# Patient Record
Sex: Female | Born: 1965 | Race: White | Hispanic: No | Smoking: Never smoker
Health system: Southern US, Community
[De-identification: ages and names within clinical notes are randomized; demographics above are authoritative.]

## PROBLEM LIST (undated history)

## (undated) DIAGNOSIS — Z87440 Personal history of urinary (tract) infections: Secondary | ICD-10-CM

## (undated) DIAGNOSIS — K5792 Diverticulitis of intestine, part unspecified, without perforation or abscess without bleeding: Secondary | ICD-10-CM

## (undated) DIAGNOSIS — A749 Chlamydial infection, unspecified: Secondary | ICD-10-CM

## (undated) DIAGNOSIS — M069 Rheumatoid arthritis, unspecified: Secondary | ICD-10-CM

## (undated) DIAGNOSIS — A499 Bacterial infection, unspecified: Secondary | ICD-10-CM

## (undated) HISTORY — DX: Rheumatoid arthritis, unspecified: M06.9

## (undated) HISTORY — DX: Bacterial infection, unspecified: A49.9

## (undated) HISTORY — DX: Personal history of urinary (tract) infections: Z87.440

## (undated) HISTORY — PX: TUBAL LIGATION: SHX77

## (undated) HISTORY — DX: Chlamydial infection, unspecified: A74.9

## (undated) HISTORY — PX: SHOULDER SURGERY: SHX246

---

## 1999-01-28 ENCOUNTER — Other Ambulatory Visit: Admission: RE | Admit: 1999-01-28 | Discharge: 1999-01-28 | Payer: Self-pay | Admitting: *Deleted

## 2000-08-22 ENCOUNTER — Other Ambulatory Visit: Admission: RE | Admit: 2000-08-22 | Discharge: 2000-08-22 | Payer: Self-pay | Admitting: Obstetrics and Gynecology

## 2001-06-19 ENCOUNTER — Encounter: Payer: Self-pay | Admitting: Obstetrics and Gynecology

## 2001-06-19 ENCOUNTER — Encounter: Admission: RE | Admit: 2001-06-19 | Discharge: 2001-06-19 | Payer: Self-pay | Admitting: Obstetrics and Gynecology

## 2001-08-22 ENCOUNTER — Other Ambulatory Visit: Admission: RE | Admit: 2001-08-22 | Discharge: 2001-08-22 | Payer: Self-pay | Admitting: Obstetrics and Gynecology

## 2002-09-04 ENCOUNTER — Other Ambulatory Visit: Admission: RE | Admit: 2002-09-04 | Discharge: 2002-09-04 | Payer: Self-pay | Admitting: Obstetrics and Gynecology

## 2003-07-24 ENCOUNTER — Ambulatory Visit (HOSPITAL_COMMUNITY): Admission: RE | Admit: 2003-07-24 | Discharge: 2003-07-24 | Payer: Self-pay | Admitting: Obstetrics and Gynecology

## 2003-07-24 ENCOUNTER — Encounter (INDEPENDENT_AMBULATORY_CARE_PROVIDER_SITE_OTHER): Payer: Self-pay | Admitting: Specialist

## 2003-09-22 ENCOUNTER — Other Ambulatory Visit: Admission: RE | Admit: 2003-09-22 | Discharge: 2003-09-22 | Payer: Self-pay | Admitting: Obstetrics and Gynecology

## 2005-10-18 ENCOUNTER — Other Ambulatory Visit: Admission: RE | Admit: 2005-10-18 | Discharge: 2005-10-18 | Payer: Self-pay | Admitting: Obstetrics and Gynecology

## 2006-06-23 ENCOUNTER — Encounter: Admission: RE | Admit: 2006-06-23 | Discharge: 2006-06-23 | Payer: Self-pay | Admitting: Obstetrics and Gynecology

## 2006-06-30 ENCOUNTER — Encounter: Admission: RE | Admit: 2006-06-30 | Discharge: 2006-06-30 | Payer: Self-pay | Admitting: Obstetrics and Gynecology

## 2009-01-09 ENCOUNTER — Encounter: Admission: RE | Admit: 2009-01-09 | Discharge: 2009-01-09 | Payer: Self-pay | Admitting: Family Medicine

## 2009-08-31 ENCOUNTER — Encounter: Admission: RE | Admit: 2009-08-31 | Discharge: 2009-08-31 | Payer: Self-pay | Admitting: Obstetrics and Gynecology

## 2009-09-21 ENCOUNTER — Encounter: Admission: RE | Admit: 2009-09-21 | Discharge: 2009-09-21 | Payer: Self-pay | Admitting: Obstetrics and Gynecology

## 2010-01-30 ENCOUNTER — Emergency Department (HOSPITAL_COMMUNITY): Admission: EM | Admit: 2010-01-30 | Discharge: 2010-01-30 | Payer: Self-pay | Admitting: Family Medicine

## 2010-09-07 ENCOUNTER — Encounter: Admission: RE | Admit: 2010-09-07 | Discharge: 2010-09-07 | Payer: Self-pay | Admitting: Family Medicine

## 2011-04-01 NOTE — Op Note (Signed)
NAME:  Mikayla Franklin, Mikayla Franklin                          ACCOUNT NO.:  [id number]   MEDICAL RECORD NO.:  [id number]                   PATIENT TYPE:  AMB   LOCATION:  SDC                                  FACILITY:  [facility]   PHYSICIAN:  Janine Limbo, M.D.            DATE OF BIRTH:  12/20/1965   DATE OF PROCEDURE:  07/24/2003  DATE OF DISCHARGE:                                 OPERATIVE REPORT   PREOPERATIVE DIAGNOSES:  1. Irregular uterine bleeding.  2. Stenotic cervix.  3. Anteflexed uterus.   POSTOPERATIVE DIAGNOSES:  1. Irregular uterine bleeding.  2. Stenotic cervix.  3. Anteflexed uterus.   PROCEDURE:  Diagnostic hysteroscopy with dilatation and curettage.   SURGEON:  Janine Limbo, M.D.   ANESTHESIA:  Monitored anesthetic control and paracervical block using 0.5%  Marcaine with epinephrine.   DISPOSITION:  [name] is a 45 year old female, para 3-0-0-3, who presents  with the above-mentioned diagnoses.  She understands the indications for her  procedure and she accepts the risks of, but not limited to, anesthetic  complications, bleeding, infection, and possible damage to the surrounding  organs.   FINDINGS:  The uterus sounded to 8 cm.  The uterus was severely anteflexed.  Upon visualization of the endometrial cavity, the cavity was noted to be  smooth.  The tubal ostia were easily visualized.  There were no abnormal  lesions present.  There was a moderate amount of endometrial tissue removed  on curettage.   DESCRIPTION OF PROCEDURE:  The patient was taken to the operating room,  where she was given medications through her IV line.  The patient's perineum  and vagina were prepped with multiple layers of Betadine.  Examination under  anesthesia was performed.  The bladder was drained of urine.  She was then  sterilely draped.  A paracervical block was placed using 10 mL of 0.5%  Marcaine with epinephrine.  An endocervical curettage was performed.  The  uterus  sounded to 8 cm.  The cervix was gradually dilated.  The diagnostic  hysteroscope was inserted and the endometrial cavity was visualized.  Pictures were taken.  The cavity was then curetted sharply.  The cavity was  felt to be clean at the end of our procedure.  The hysteroscope was inserted  once again and the cavity was again inspected.  There was no evidence of  damage to the uterus.  Hemostasis was adequate.  All instruments were  removed.  Repeat examination showed the uterus to be firm.  The sponge,  needle, and instrument counts were correct.  The estimated blood loss was 10  mL.  There was a 60 mL fluid deficit on hysteroscopy, but there was fluid  noted on the floor.  The patient was awakened from her anesthetic and taken  to the recovery room in stable condition.  She tolerated her procedure well.   POSTOPERATIVE INSTRUCTIONS:  The patient will  return to see Dr. Stefano Gaul in  two to three weeks for follow-up examination.  She was given a copy of the  postoperative instruction sheet as prepared by the [facility]  of [facility] for patients who have undergone a dilatation and curettage.  She was given a prescription for Darvocet-N 100, and she will use one tablet  every six hours as needed for pain.  She will call with questions or  concerns.                                               Janine Limbo, M.D.    AVS/MEDQ  D:  07/24/2003  T:  07/24/2003  Job:  [id number]

## 2011-04-01 NOTE — H&P (Signed)
   NAME:  Mikayla Franklin, Mikayla Franklin                    ACCOUNT NO.:  [id number]   MEDICAL RECORD NO.:  [id number]                   PATIENT TYPE:  AMB   LOCATION:  SDC                                  FACILITY:  [facility]   PHYSICIAN:  Janine Limbo, M.D.            DATE OF BIRTH:  Sep 01, 1966   DATE OF ADMISSION:  DATE OF DISCHARGE:                                HISTORY & PHYSICAL   HISTORY OF PRESENT ILLNESS:  The patient is a 46 year old female, para 3-0-0-  3, who presents for a dilatation and curettage with hysteroscopy because of  irregular bleeding.  We were unable to perform a hydrosonogram in the  office.   PAST MEDICAL HISTORY:  The patient has had three cesarean sections and a  tubal ligation in the past.  Generally speaking she is very healthy.   DRUG ALLERGIES:  The patient reports that she is sensitive to environmental  allergens and she treats these with over-the-counter antihistamines.   REVIEW OF SYSTEMS:  The patient complains of irregular bleeding as mentioned  above.   FAMILY HISTORY:  Noncontributory.   SOCIAL HISTORY:  The patient denies cigarette use, alcohol use, and  recreational drug use.   PHYSICAL EXAMINATION:  VITAL SIGNS:  Weight is 165 pounds.  HEENT:  Within normal limits.  CHEST:  Clear.  HEART:  Regular rate and rhythm.  BREASTS:  Without masses.  ABDOMEN:  Nontender.  EXTREMITIES:  Within normal limits.  NEUROLOGICAL:  Grossly normal.  PELVIC:  External genitalia is normal.  The vagina is normal.  The cervix is  very anterior and it is difficult to visualize the cervix and the os.  It is  very uncomfortable to try to manipulate the cervix to bring it into vision.  The uterus is approximately 10 cm in size and is severely retroflexed.  Adnexa:  No masses appreciated.  Rectovaginal exam confirms.   LABORATORY AND ACCESSORY DATA:  Ultrasound performed on June 18, 2003  showed an 11.1 x 6.5 x 5.5 cm uterus.  The ovaries appeared normal.  The  endometrial thickness was 1.48 cm.   ASSESSMENT:  1. Irregular bleeding.  2. Retroflexed uterus.  3. Unable to visualize the cervix in the office.    PLAN:  The patient will undergo a dilatation and curettage.  She understands  the indications for her procedure and she accepts the risks of, but not  limited to, anesthetic complications, bleeding, infections, and possible  damage to the surrounding organs.                                               Janine Limbo, M.D.    AVS/MEDQ  D:  07/21/2003  T:  07/21/2003  Job:  [id number]

## 2011-08-25 ENCOUNTER — Other Ambulatory Visit: Payer: Self-pay | Admitting: Family Medicine

## 2011-08-25 DIAGNOSIS — M542 Cervicalgia: Secondary | ICD-10-CM

## 2011-09-01 ENCOUNTER — Other Ambulatory Visit: Payer: Self-pay

## 2011-09-06 ENCOUNTER — Ambulatory Visit
Admission: RE | Admit: 2011-09-06 | Discharge: 2011-09-06 | Disposition: A | Discharge status: Home / Self Care | Payer: BC Managed Care – PPO | Source: Ambulatory Visit | Attending: Family Medicine | Admitting: Family Medicine

## 2011-09-06 DIAGNOSIS — M542 Cervicalgia: Secondary | ICD-10-CM

## 2012-10-05 ENCOUNTER — Ambulatory Visit
Admission: RE | Admit: 2012-10-05 | Discharge: 2012-10-05 | Disposition: A | Discharge status: Home / Self Care | Payer: BC Managed Care – PPO | Source: Ambulatory Visit | Attending: Rheumatology | Admitting: Rheumatology

## 2012-10-05 ENCOUNTER — Other Ambulatory Visit: Payer: Self-pay | Admitting: Rheumatology

## 2012-10-05 DIAGNOSIS — Z0000 Encounter for general adult medical examination without abnormal findings: Secondary | ICD-10-CM

## 2013-01-04 ENCOUNTER — Telehealth: Payer: Self-pay | Admitting: Obstetrics and Gynecology

## 2013-01-17 ENCOUNTER — Ambulatory Visit: Payer: 59 | Admitting: Obstetrics and Gynecology

## 2013-01-17 ENCOUNTER — Encounter: Payer: Self-pay | Admitting: Obstetrics and Gynecology

## 2013-01-17 VITALS — BP 104/70 | Temp 98.6°F | Ht 61.0 in | Wt 159.0 lb

## 2013-01-17 DIAGNOSIS — Z01419 Encounter for gynecological examination (general) (routine) without abnormal findings: Secondary | ICD-10-CM

## 2013-01-17 DIAGNOSIS — N898 Other specified noninflammatory disorders of vagina: Secondary | ICD-10-CM

## 2013-01-17 DIAGNOSIS — Z124 Encounter for screening for malignant neoplasm of cervix: Secondary | ICD-10-CM

## 2013-01-17 DIAGNOSIS — R300 Dysuria: Secondary | ICD-10-CM

## 2013-01-17 DIAGNOSIS — R102 Pelvic and perineal pain: Secondary | ICD-10-CM

## 2013-01-17 LAB — POCT URINALYSIS DIPSTICK: pH, UA: 7

## 2013-01-17 LAB — POCT WET PREP (WET MOUNT): Whiff Test: NEGATIVE

## 2013-01-17 NOTE — Progress Notes (Signed)
Subjective:    Mikayla Franklin is a 47 y.o. female, G3P0, who presents for an annual exam. The patient reports left lower quadrant pelvic pain for the past 3 weeks that is dull and made worse by lying on the left side, certain sitting positions, and wearing tight paints. Denies urinary tract symptoms, bowel symptoms or irregular bleeding.  Admits to vaginal discharge and some increase in her left lower quadrant pelvic discomfort when she urinates.  Menstrual cycle:   LMP: Patient's last menstrual period was 01/06/2013. Very light flow x 5 days, only uses toilet paper that is changed 3 times a day             Review of Systems Pertinent items are noted in HPI. Denies pelvic pain, urinary tract symptoms, vaginitis symptoms, irregular bleeding, menopausal symptoms, change in bowel habits or rectal bleeding   Objective:    BP 104/70  Temp(Src) 98.6 F (37 C)  Ht 5\' 1"  (1.549 m)  Wt 159 lb (72.122 kg)  BMI 30.06 kg/m2  LMP 01/06/2013    Wt Readings from Last 1 Encounters:  01/17/13 159 lb (72.122 kg)   Body mass index is 30.06 kg/(m^2). General Appearance: Alert, no acute distress HEENT: Grossly normal Neck / Thyroid: Supple, no thyromegaly or cervical adenopathy Lungs: Clear to auscultation bilaterally Back: No CVA tenderness Breast Exam: No masses or nodes.No dimpling, nipple retraction or discharge. Cardiovascular: Regular rate and rhythm.  Gastrointestinal: Soft, non-tender, no masses or organomegaly Pelvic Exam: EGBUS-wnl, vagina-normal rugae, cervix- without lesions or tenderness, uterus appears normal size shape and consistency, adnexae-no masses and only mild  tenderness in both adnexae Lymphatic Exam: Non-palpable nodes in neck, clavicular,  axillary, or inguinal regions  Skin: no rashes or abnormalities Extremities: no clubbing cyanosis or edema  Neurologic: grossly normal Psychiatric: Alert and oriented  Wet Prep: pH-4.5, whiff-negative, no clue, trich or  yeast Urinalysis: SG: 1.010, pH-7.0, trace-leukocytes   Assessment:   Routine GYN Exam Pelvic Pain (LLQ) Rheumatoid Arthritis Plan:  Pelvic ultrasound to rule out pelvic masses   PAP sent  RTO 1 year or prn  Crisanto Nied,ELMIRAPA-C

## 2013-01-17 NOTE — Progress Notes (Signed)
Regular Periods: no Mammogram: yes  Monthly Breast Ex.: no Exercise: no  Tetanus < 10 years: no Seatbelts: yes  NI. Bladder Functn.: yes Abuse at home: no  Daily BM's: yes Stressful Work: no  Healthy Diet: yes Sigmoid-Colonoscopy: no  Calcium: no Medical problems this year: pain on left side   LAST PAP:4 years ago  Contraception: btl  Mammogram:  4 years ago  PCP: Dr. Clelia Croft  PMH: no change  FMH: no change  Last Bone Scan: no  Pt is married

## 2013-01-18 LAB — PAP IG W/ RFLX HPV ASCU

## 2013-01-24 ENCOUNTER — Other Ambulatory Visit: Payer: 59

## 2013-01-24 ENCOUNTER — Ambulatory Visit: Payer: 59 | Admitting: Obstetrics and Gynecology

## 2013-01-24 ENCOUNTER — Encounter: Payer: Self-pay | Admitting: Obstetrics and Gynecology

## 2013-01-24 VITALS — BP 100/62 | Temp 98.3°F | Wt 156.0 lb

## 2013-01-24 DIAGNOSIS — R102 Pelvic and perineal pain: Secondary | ICD-10-CM

## 2013-01-24 NOTE — Progress Notes (Signed)
47 YO with pelvic pain for ultrasound.  O: U/S: uterus-7.54 x 5.65 x 4.84 cm; left ovary 2.05 x 1.59 x 1.33 cm and right ovary 3.78 x 1.98 x 2.19 cm no fluid in CDS and normal adnexae   A: Pelvic Pain  P:  Reviewed causes of pelvic pain: urogenital, previous surgery, gastrointestinal and musculoskeletal.       F/U with PCP for pelvic pain evaluation-referred to Dr. Brayton El       RTO-as scheduled or prn.  POWELL,ELMIRA

## 2013-03-11 ENCOUNTER — Other Ambulatory Visit: Payer: Self-pay | Admitting: Family Medicine

## 2013-03-11 DIAGNOSIS — R110 Nausea: Secondary | ICD-10-CM

## 2013-03-13 ENCOUNTER — Ambulatory Visit
Admission: RE | Admit: 2013-03-13 | Discharge: 2013-03-13 | Disposition: A | Discharge status: Home / Self Care | Payer: 59 | Source: Ambulatory Visit | Attending: Family Medicine | Admitting: Family Medicine

## 2013-03-13 DIAGNOSIS — R110 Nausea: Secondary | ICD-10-CM

## 2013-03-28 ENCOUNTER — Other Ambulatory Visit (HOSPITAL_COMMUNITY): Payer: Self-pay | Admitting: Family Medicine

## 2013-03-28 DIAGNOSIS — R110 Nausea: Secondary | ICD-10-CM

## 2013-04-17 ENCOUNTER — Encounter (HOSPITAL_COMMUNITY)
Admission: RE | Admit: 2013-04-17 | Discharge: 2013-04-17 | Disposition: A | Discharge status: Home / Self Care | Payer: 59 | Source: Ambulatory Visit | Attending: Family Medicine | Admitting: Family Medicine

## 2013-04-17 DIAGNOSIS — R110 Nausea: Secondary | ICD-10-CM | POA: Insufficient documentation

## 2013-04-17 MED ORDER — TECHNETIUM TC 99M SULFUR COLLOID
2.0000 | Freq: Once | INTRAVENOUS | Status: AC | PRN
  Administered 2013-04-17: 2 via INTRAVENOUS

## 2014-08-26 ENCOUNTER — Emergency Department (HOSPITAL_COMMUNITY)
Admission: EM | Admit: 2014-08-26 | Discharge: 2014-08-27 | Disposition: A | Discharge status: Home / Self Care | Payer: 59 | Attending: Emergency Medicine | Admitting: Emergency Medicine

## 2014-08-26 ENCOUNTER — Encounter (HOSPITAL_COMMUNITY): Payer: Self-pay | Admitting: Emergency Medicine

## 2014-08-26 ENCOUNTER — Emergency Department (INDEPENDENT_AMBULATORY_CARE_PROVIDER_SITE_OTHER)
Admission: EM | Admit: 2014-08-26 | Discharge: 2014-08-26 | Disposition: A | Discharge status: Nursing Home (Includes ICF) | Payer: 59 | Source: Home / Self Care

## 2014-08-26 ENCOUNTER — Emergency Department (HOSPITAL_COMMUNITY): Payer: 59

## 2014-08-26 DIAGNOSIS — K5732 Diverticulitis of large intestine without perforation or abscess without bleeding: Secondary | ICD-10-CM | POA: Insufficient documentation

## 2014-08-26 DIAGNOSIS — R1031 Right lower quadrant pain: Secondary | ICD-10-CM

## 2014-08-26 DIAGNOSIS — Z8619 Personal history of other infectious and parasitic diseases: Secondary | ICD-10-CM | POA: Diagnosis not present

## 2014-08-26 DIAGNOSIS — R1033 Periumbilical pain: Secondary | ICD-10-CM | POA: Diagnosis present

## 2014-08-26 DIAGNOSIS — Z8739 Personal history of other diseases of the musculoskeletal system and connective tissue: Secondary | ICD-10-CM | POA: Diagnosis not present

## 2014-08-26 DIAGNOSIS — Z3202 Encounter for pregnancy test, result negative: Secondary | ICD-10-CM | POA: Diagnosis not present

## 2014-08-26 DIAGNOSIS — Z87448 Personal history of other diseases of urinary system: Secondary | ICD-10-CM | POA: Insufficient documentation

## 2014-08-26 DIAGNOSIS — R509 Fever, unspecified: Secondary | ICD-10-CM

## 2014-08-26 DIAGNOSIS — R1032 Left lower quadrant pain: Secondary | ICD-10-CM

## 2014-08-26 LAB — URINALYSIS, ROUTINE W REFLEX MICROSCOPIC
BILIRUBIN URINE: NEGATIVE
Glucose, UA: NEGATIVE mg/dL
HGB URINE DIPSTICK: NEGATIVE
KETONES UR: NEGATIVE mg/dL
Leukocytes, UA: NEGATIVE
Nitrite: NEGATIVE
PROTEIN: NEGATIVE mg/dL
SPECIFIC GRAVITY, URINE: 1.014 (ref 1.005–1.030)
Urobilinogen, UA: 0.2 mg/dL (ref 0.0–1.0)
pH: 5.5 (ref 5.0–8.0)

## 2014-08-26 LAB — CBC WITH DIFFERENTIAL/PLATELET
Basophils Absolute: 0 10*3/uL (ref 0.0–0.1)
Basophils Relative: 0 % (ref 0–1)
Eosinophils Absolute: 0.1 10*3/uL (ref 0.0–0.7)
Eosinophils Relative: 0 % (ref 0–5)
HCT: 37.3 % (ref 36.0–46.0)
Hemoglobin: 12.5 g/dL (ref 12.0–15.0)
Lymphocytes Relative: 9 % — ABNORMAL LOW (ref 12–46)
Lymphs Abs: 1.2 10*3/uL (ref 0.7–4.0)
MCH: 29.8 pg (ref 26.0–34.0)
MCHC: 33.5 g/dL (ref 30.0–36.0)
MCV: 89 fL (ref 78.0–100.0)
Monocytes Absolute: 0.9 10*3/uL (ref 0.1–1.0)
Monocytes Relative: 7 % (ref 3–12)
Neutro Abs: 10.7 10*3/uL — ABNORMAL HIGH (ref 1.7–7.7)
Neutrophils Relative %: 84 % — ABNORMAL HIGH (ref 43–77)
Platelets: 197 10*3/uL (ref 150–400)
RBC: 4.19 MIL/uL (ref 3.87–5.11)
RDW: 13.2 % (ref 11.5–15.5)
WBC: 12.8 10*3/uL — ABNORMAL HIGH (ref 4.0–10.5)

## 2014-08-26 LAB — COMPREHENSIVE METABOLIC PANEL WITH GFR
ALT: 18 U/L (ref 0–35)
AST: 29 U/L (ref 0–37)
Albumin: 4.1 g/dL (ref 3.5–5.2)
Alkaline Phosphatase: 84 U/L (ref 39–117)
Anion gap: 13 (ref 5–15)
BUN: 11 mg/dL (ref 6–23)
CO2: 23 meq/L (ref 19–32)
Calcium: 9.1 mg/dL (ref 8.4–10.5)
Chloride: 103 meq/L (ref 96–112)
Creatinine, Ser: 0.68 mg/dL (ref 0.50–1.10)
GFR calc Af Amer: >90 mL/min
GFR calc non Af Amer: >90 mL/min
Glucose, Bld: 94 mg/dL (ref 70–99)
Potassium: 3.8 meq/L (ref 3.7–5.3)
Sodium: 139 meq/L (ref 137–147)
Total Bilirubin: 0.4 mg/dL (ref 0.3–1.2)
Total Protein: 8 g/dL (ref 6.0–8.3)

## 2014-08-26 LAB — POCT PREGNANCY, URINE: Preg Test, Ur: NEGATIVE

## 2014-08-26 LAB — POCT URINALYSIS DIP (DEVICE)
Bilirubin Urine: NEGATIVE
GLUCOSE, UA: NEGATIVE mg/dL
Ketones, ur: NEGATIVE mg/dL
LEUKOCYTES UA: NEGATIVE
Nitrite: NEGATIVE
PROTEIN: NEGATIVE mg/dL
Specific Gravity, Urine: 1.015 (ref 1.005–1.030)
UROBILINOGEN UA: 0.2 mg/dL (ref 0.0–1.0)
pH: 7 (ref 5.0–8.0)

## 2014-08-26 LAB — PREGNANCY, URINE: Preg Test, Ur: NEGATIVE

## 2014-08-26 MED ORDER — MORPHINE SULFATE 4 MG/ML IJ SOLN
4.0000 mg | INTRAMUSCULAR | Status: DC | PRN
  Administered 2014-08-26: 4 mg via INTRAVENOUS

## 2014-08-26 MED ORDER — LEVOFLOXACIN IN D5W 750 MG/150ML IV SOLN
750.0000 mg | Freq: Once | INTRAVENOUS | Status: AC
  Administered 2014-08-26: 750 mg via INTRAVENOUS
  Filled 2014-08-26: qty 150

## 2014-08-26 MED ORDER — ONDANSETRON HCL 4 MG/2ML IJ SOLN
4.0000 mg | Freq: Once | INTRAMUSCULAR | Status: AC
  Administered 2014-08-26: 4 mg via INTRAVENOUS
  Filled 2014-08-26: qty 2

## 2014-08-26 MED ORDER — HYDROCODONE-ACETAMINOPHEN 5-325 MG PO TABS: 2.0000 | ORAL_TABLET | ORAL | Status: AC | PRN

## 2014-08-26 MED ORDER — CIPROFLOXACIN HCL 500 MG PO TABS: 500.0000 mg | ORAL_TABLET | Freq: Two times a day (BID) | ORAL | Status: AC

## 2014-08-26 MED ORDER — IOHEXOL 300 MG/ML  SOLN
80.0000 mL | Freq: Once | INTRAMUSCULAR | Status: AC | PRN
  Administered 2014-08-26: 80 mL via INTRAVENOUS

## 2014-08-26 MED ORDER — ONDANSETRON 4 MG PO TBDP: 4.0000 mg | ORAL_TABLET | Freq: Three times a day (TID) | ORAL | Status: AC | PRN

## 2014-08-26 MED ORDER — IOHEXOL 300 MG/ML  SOLN
25.0000 mL | INTRAMUSCULAR | Status: AC
  Administered 2014-08-26: 25 mL via ORAL

## 2014-08-26 MED ORDER — METRONIDAZOLE 500 MG PO TABS: 500.0000 mg | ORAL_TABLET | Freq: Two times a day (BID) | ORAL | Status: AC

## 2014-08-26 MED ORDER — MORPHINE SULFATE 4 MG/ML IJ SOLN
4.0000 mg | INTRAMUSCULAR | Status: DC | PRN
  Administered 2014-08-26: 4 mg via INTRAVENOUS
  Filled 2014-08-26 (×2): qty 1

## 2014-08-26 MED ORDER — DOCUSATE SODIUM 100 MG PO CAPS: 100.0000 mg | ORAL_CAPSULE | Freq: Two times a day (BID) | ORAL | Status: AC

## 2014-08-26 MED ORDER — METRONIDAZOLE IN NACL 5-0.79 MG/ML-% IV SOLN
500.0000 mg | Freq: Once | INTRAVENOUS | Status: AC
  Administered 2014-08-26: 500 mg via INTRAVENOUS
  Filled 2014-08-26: qty 100

## 2014-08-26 NOTE — ED Notes (Signed)
Pt reports "abdominal pain to left lower and right lower that moves around to different places for three days." Only change in diet is that "I have eaten a lot more wheat than I normally eat." Denies N/V/D/bleeding/abnormal stools.

## 2014-08-26 NOTE — ED Notes (Signed)
*  Pt Flagyl infusion not finished ("states the stopped it at CT).

## 2014-08-26 NOTE — ED Notes (Signed)
Pt states that she has had abd pain for 3 to 4 days with body chills. Pt states that it is a 4 out of 10 pain no acute distress noted.

## 2014-08-26 NOTE — Discharge Instructions (Signed)
Diverticulitis °Diverticulitis is when small pockets that have formed in your colon (large intestine) become infected or swollen. °HOME CARE °· Follow your doctor's instructions. °· Follow a special diet if told by your doctor. °· When you feel better, your doctor may tell you to change your diet. You may be told to eat a lot of fiber. Fruits and vegetables are good sources of fiber. Fiber makes it easier to poop (have bowel movements). °· Take supplements or probiotics as told by your doctor. °· Only take medicines as told by your doctor. °· Keep all follow-up visits with your doctor. °GET HELP IF: °· Your pain does not get better. °· You have a hard time eating food. °· You are not pooping like normal. °GET HELP RIGHT AWAY IF: °· Your pain gets worse. °· Your problems do not get better. °· Your problems suddenly get worse. °· You have a fever. °· You keep throwing up (vomiting). °· You have bloody or black, tarry poop (stool). °MAKE SURE YOU:  °· Understand these instructions. °· Will watch your condition. °· Will get help right away if you are not doing well or get worse. °Document Released: 04/18/2008 Document Revised: 11/05/2013 Document Reviewed: 09/25/2013 °Exit[employer]® Patient Information ©2015 [employer]. This information is not intended to replace advice given to you by your health care provider. Make sure you discuss any questions you have with your health care provider. ° °

## 2014-08-26 NOTE — ED Provider Notes (Signed)
CSN: [id number]     Arrival date & time 08/26/14  1427 History   First MD Initiated Contact with Patient 08/26/14 1558     Chief Complaint  Patient presents with  . Abdominal Pain   (Consider location/radiation/quality/duration/timing/severity/associated sxs/prior Treatment) HPI Comments: 48 year old female complaining of lower abdominal pain associated with fever for 3-4 days. The fever actually started yesterday. Denies nausea or vomiting. Has had normal bowel movements for her without change. Denies chest pain, shortness of breath, diaphoresis. Surgical history includes C-section otherwise negative. Denies genitourinary symptoms.   Past Medical History  Diagnosis Date  . H/O bladder infections   . Chlamydia     h/o  . Bacterial infection     h/o  . Rheumatoid arthritis(714.0)    Past Surgical History  Procedure Laterality Date  . Cesarean section    . Shoulder surgery    . Tubal ligation     Family History  Problem Relation Age of Onset  . Cancer Maternal Grandmother     skin  . Prostate cancer Father   . Thyroid disease Mother     hypo   History  Substance Use Topics  . Smoking status: Never Smoker   . Smokeless tobacco: Never Used  . Alcohol Use: Yes     Comment: 2-3 times weekly   OB History   Grav Para Term Preterm Abortions TAB SAB Ect Mult Living   3         3     Review of Systems  Constitutional: Positive for fever and activity change. Negative for chills, appetite change and fatigue.  HENT: Negative.   Respiratory: Negative for cough and shortness of breath.   Cardiovascular: Negative for chest pain, palpitations and leg swelling.  Gastrointestinal: Positive for abdominal pain. Negative for nausea, vomiting, diarrhea, constipation, blood in stool, abdominal distention and rectal pain.  Genitourinary: Negative for dysuria, frequency, flank pain, vaginal bleeding, vaginal discharge and pelvic pain.  Musculoskeletal: Negative.   Skin: Negative.    Neurological: Negative.   Psychiatric/Behavioral: Negative.     Allergies  Codeine  Home Medications   Prior to Admission medications   Medication Sig Start Date End Date Taking? Authorizing Provider  Adalimumab (HUMIRA [place]) Inject into the skin.   Yes Historical Provider, MD  folic acid (FOLVITE) 1 MG tablet Take 1 mg by mouth daily.    Historical Provider, MD  hydroxychloroquine (PLAQUENIL) 200 MG tablet Take by mouth daily.    Historical Provider, MD  Methotrexate Sodium (METHOTREXATE PO) Take by mouth.    Historical Provider, MD   BP 136/95  Pulse 99  Temp(Src) 100 F (37.8 C) (Oral)  Resp 16  SpO2 100% Physical Exam  Nursing note and vitals reviewed. Constitutional: She is oriented to person, place, and time. She appears well-developed and well-nourished. No distress.  Does not appear acutely ill. Smiling, nl gait with ability to ambulate and place self onto exam table without apparent difficulty, weakness or slowness of movement.  Eyes: Conjunctivae and EOM are normal.  Neck: Normal range of motion. Neck supple.  Cardiovascular: Normal rate, regular rhythm, normal heart sounds and intact distal pulses.   Pulmonary/Chest: Effort normal and breath sounds normal. No respiratory distress.  Abdominal: Soft. Bowel sounds are normal. She exhibits no distension and no mass. There is tenderness. There is guarding.  Abdominal exam reveals moderate to severe tenderness in the left and right lower quadrants with left greater than right. There is tenderness just left of the umbilicus as  well as in the upper left pelvis.  Musculoskeletal: She exhibits no edema.  Lymphadenopathy:    She has no cervical adenopathy.  Neurological: She is alert and oriented to person, place, and time. She exhibits normal muscle tone.  Skin: Skin is warm and dry.  Psychiatric: She has a normal mood and affect.    ED Course  Procedures (including critical care time) Labs Review Labs Reviewed  POCT  URINALYSIS DIP (DEVICE) - Abnormal; Notable for the following:    Hgb urine dipstick TRACE (*)    All other components within normal limits  POCT PREGNANCY, URINE   Results for orders placed during the hospital encounter of 08/26/14  POCT URINALYSIS DIP (DEVICE)      Result Value Ref Range   Glucose, UA NEGATIVE  NEGATIVE mg/dL   Bilirubin Urine NEGATIVE  NEGATIVE   Ketones, ur NEGATIVE  NEGATIVE mg/dL   Specific Gravity, Urine 1.015  1.005 - 1.030   Hgb urine dipstick TRACE (*) NEGATIVE   pH 7.0  5.0 - 8.0   Protein, ur NEGATIVE  NEGATIVE mg/dL   Urobilinogen, UA 0.2  0.0 - 1.0 mg/dL   Nitrite NEGATIVE  NEGATIVE   Leukocytes, UA NEGATIVE  NEGATIVE  POCT PREGNANCY, URINE      Result Value Ref Range   Preg Test, Ur NEGATIVE  NEGATIVE     Imaging Review No results found.   MDM   1. Acute bilateral lower abdominal pain   2. Other specified fever    Transfer to the [place] for evaluation of acute lower abdominal pain associated with fever. Diff would include diverticulitis, colonitis,appendicitis, ovarian etiology, mass effect.    Hayden Rasmussenavid Pilar Westergaard, NP 08/26/14 [phone]

## 2014-08-26 NOTE — ED Notes (Signed)
Pt is being transferred to the ED [place]. Spoke with Ali LoweJenna Gage RN to inform her that pt is being transferred via shuttle

## 2014-08-26 NOTE — ED Notes (Signed)
Pt c/o LLQ abd pain for the past 3 days but started running a fever today. No N/V/D. No vaginal bleeding or discharge noted.

## 2014-08-26 NOTE — ED Provider Notes (Signed)
CSN: [id number]     Arrival date & time 08/26/14  1727 History   First MD Initiated Contact with Patient 08/26/14 2002     Chief Complaint  Patient presents with  . Abdominal Pain      HPI  Patient presents for evaluation of abdominal pain. Para -Umbilical abdominal pain last 2 days fever of 100.3 at home today. Seen in urgent care and referred here.  No bowel movements. No urinary symptoms.  Past Medical History  Diagnosis Date  . H/O bladder infections   . Chlamydia     h/o  . Bacterial infection     h/o  . Rheumatoid arthritis(714.0)    Past Surgical History  Procedure Laterality Date  . Cesarean section    . Shoulder surgery    . Tubal ligation     Family History  Problem Relation Age of Onset  . Cancer Maternal Grandmother     skin  . Prostate cancer Father   . Thyroid disease Mother     hypo   History  Substance Use Topics  . Smoking status: Never Smoker   . Smokeless tobacco: Never Used  . Alcohol Use: Yes     Comment: 2-3 times weekly   OB History   Grav Para Term Preterm Abortions TAB SAB Ect Mult Living   3         3     Review of Systems  Constitutional: Positive for appetite change. Negative for fever, chills, diaphoresis and fatigue.  HENT: Negative for mouth sores, sore throat and trouble swallowing.   Eyes: Negative for visual disturbance.  Respiratory: Negative for cough, chest tightness, shortness of breath and wheezing.   Cardiovascular: Negative for chest pain.  Gastrointestinal: Positive for abdominal pain. Negative for nausea, vomiting, diarrhea and abdominal distention.  Endocrine: Negative for polydipsia, polyphagia and polyuria.  Genitourinary: Negative for dysuria, frequency and hematuria.  Musculoskeletal: Negative for gait problem.  Skin: Negative for color change, pallor and rash.  Neurological: Negative for dizziness, syncope, light-headedness and headaches.  Hematological: Does not bruise/bleed easily.    Psychiatric/Behavioral: Negative for behavioral problems and confusion.      Allergies  Codeine  Home Medications   Prior to Admission medications   Medication Sig Start Date End Date Taking? Authorizing Provider  Adalimumab (HUMIRA [place]) Inject 1 each into the skin every 14 (fourteen) days.    Yes Historical Provider, MD  Ca Carbonate-Mag Hydroxide (ROLAIDS PO) Take 1-4 each by mouth at bedtime.   Yes Historical Provider, MD  ciprofloxacin (CIPRO) 500 MG tablet Take 1 tablet (500 mg total) by mouth every 12 (twelve) hours. 08/26/14   Rolland PorterMark Yamil Oelke, MD  docusate sodium (COLACE) 100 MG capsule Take 1 capsule (100 mg total) by mouth every 12 (twelve) hours. 08/26/14   Rolland PorterMark Labrenda Lasky, MD  HYDROcodone-acetaminophen (NORCO/VICODIN) 5-325 MG per tablet Take 2 tablets by mouth every 4 (four) hours as needed. 08/26/14   Rolland PorterMark Darlen Gledhill, MD  metroNIDAZOLE (FLAGYL) 500 MG tablet Take 1 tablet (500 mg total) by mouth 2 (two) times daily. 08/26/14   Rolland PorterMark Layliana Devins, MD  ondansetron (ZOFRAN ODT) 4 MG disintegrating tablet Take 1 tablet (4 mg total) by mouth every 8 (eight) hours as needed for nausea. 08/26/14   Rolland PorterMark Keola Heninger, MD   BP 123/66  Pulse 96  Temp(Src) 99.7 F (37.6 C) (Oral)  Resp 14  Ht 5' (1.524 m)  Wt 175 lb (79.379 kg)  BMI 34.18 kg/m2  SpO2 100%  LMP 08/05/2014 Physical Exam  Constitutional: She is oriented to person, place, and time. She appears well-developed and well-nourished. No distress.  HENT:  Head: Normocephalic.  Eyes: Conjunctivae are normal. Pupils are equal, round, and reactive to light. No scleral icterus.  Neck: Normal range of motion. Neck supple. No thyromegaly present.  Cardiovascular: Normal rate and regular rhythm.  Exam reveals no gallop and no friction rub.   No murmur heard. Pulmonary/Chest: Effort normal and breath sounds normal. No respiratory distress. She has no wheezes. She has no rales.  Abdominal: Soft. Bowel sounds are normal. She exhibits no distension. There is  no tenderness. There is no rebound.    Musculoskeletal: Normal range of motion.  Neurological: She is alert and oriented to person, place, and time.  Skin: Skin is warm and dry. No rash noted.  Psychiatric: She has a normal mood and affect. Her behavior is normal.    ED Course  Procedures (including critical care time) Labs Review Labs Reviewed  CBC WITH DIFFERENTIAL - Abnormal; Notable for the following:    WBC 12.8 (*)    Neutrophils Relative % 84 (*)    Neutro Abs 10.7 (*)    Lymphocytes Relative 9 (*)    All other components within normal limits  COMPREHENSIVE METABOLIC PANEL  URINALYSIS, ROUTINE W REFLEX MICROSCOPIC  PREGNANCY, URINE    Imaging Review Ct Abdomen Pelvis W Contrast  08/26/2014   CLINICAL DATA:  Abdominal pain, left lower quadrant, with fever. Initial encounter.  EXAM: CT ABDOMEN AND PELVIS WITH CONTRAST  TECHNIQUE: Multidetector CT imaging of the abdomen and pelvis was performed using the standard protocol following bolus administration of intravenous contrast.  CONTRAST:  80mL OMNIPAQUE IOHEXOL 300 MG/ML  SOLN  COMPARISON:  None.  FINDINGS: BODY WALL: Unremarkable.  LOWER CHEST: Unremarkable.  ABDOMEN/PELVIS:  Liver: No focal abnormality.  Biliary: No evidence of biliary obstruction or stone.  Pancreas: Unremarkable.  Spleen: Unremarkable.  Adrenals: Unremarkable.  Kidneys and ureters: No hydronephrosis or stone.  Bladder: Unremarkable.  Reproductive: Hypoenhancing intramural and sub serosal mass from the ventral uterine body which measures up to 5 cm. This appears discrete from the left ovary.  Bowel: Sigmoid diverticulosis with focally inflamed diverticulum. Mesosigmoid infiltration and anti mesenteric sigmoid colonic wall thickening from submucosal edema. No free perforation or abscess. No bowel obstruction. No appendiceal inflammation. Small sliding-type hiatal hernia.  Retroperitoneum: No mass or adenopathy.  Peritoneum: No ascites or pneumoperitoneum.  Vascular:  No acute abnormality.  OSSEOUS: No acute abnormalities.  IMPRESSION: 1. Sigmoid diverticulitis without abscess or perforation. 2. 5 cm uterine fibroid.   Electronically Signed   By: Tiburcio PeaJonathan  Watts M.D.   On: 08/26/2014 23:28     EKG Interpretation None      MDM   Final diagnoses:  Diverticulitis of large intestine without perforation or abscess without bleeding    Patient has an uncomplicated diverticulitis on CT. No abscess or perforation. Discussed with her that my preference would be to admit her to the hospital considering her Humira immune suppression. She politely declines. Bastard recheck here and have a low threshold recheck with any worsening revolving symptoms. As for her to be seen and evaluated by her primary care physician in the next few days as well. Discharge home with Vicodin, Colace, Zofran, Flagyl, Cipro.    Rolland PorterMark Adell Koval, MD 08/26/14 2351

## 2014-08-27 MED ORDER — HYDROCODONE-ACETAMINOPHEN 5-325 MG PO TABS: 2.0000 | ORAL_TABLET | ORAL | Status: AC | PRN

## 2014-08-27 NOTE — ED Provider Notes (Signed)
Medical screening examination/treatment/procedure(s) were performed by a resident physician or non-physician practitioner and as the supervising physician I was immediately available for consultation/collaboration.  Nakenya Theall, MD Family Medicine   Quinisha Mould J Danell Vazquez, MD 08/27/14 1800 

## 2014-09-12 ENCOUNTER — Other Ambulatory Visit (HOSPITAL_COMMUNITY): Payer: Self-pay | Admitting: Family Medicine

## 2014-09-12 ENCOUNTER — Ambulatory Visit (HOSPITAL_COMMUNITY)
Admission: RE | Admit: 2014-09-12 | Discharge: 2014-09-12 | Disposition: A | Discharge status: Home / Self Care | Payer: 59 | Source: Ambulatory Visit | Attending: Diagnostic Radiology | Admitting: Diagnostic Radiology

## 2014-09-12 DIAGNOSIS — K5792 Diverticulitis of intestine, part unspecified, without perforation or abscess without bleeding: Secondary | ICD-10-CM | POA: Diagnosis not present

## 2014-09-12 DIAGNOSIS — R1032 Left lower quadrant pain: Secondary | ICD-10-CM | POA: Diagnosis not present

## 2014-09-12 MED ORDER — IOHEXOL 300 MG/ML  SOLN
100.0000 mL | Freq: Once | INTRAMUSCULAR | Status: AC | PRN
  Administered 2014-09-12: 100 mL via INTRAVENOUS

## 2014-09-12 MED ORDER — IOHEXOL 300 MG/ML  SOLN
50.0000 mL | Freq: Once | INTRAMUSCULAR | Status: AC | PRN
  Administered 2014-09-12: 50 mL via ORAL

## 2014-09-15 ENCOUNTER — Encounter (HOSPITAL_COMMUNITY): Payer: Self-pay | Admitting: Emergency Medicine

## 2016-03-23 IMAGING — CT CT ABD-PELV W/ CM
2 of 5 series · 16 of 46 positions shown, 18 images · IV contrast (APPLIED)
Comparison: None.

CLINICAL DATA: Abdominal pain, left lower quadrant, with fever.
Initial encounter.

EXAM:
CT ABDOMEN AND PELVIS WITH CONTRAST
TECHNIQUE: Multidetector CT imaging of the abdomen and pelvis was performed
using the standard protocol following bolus administration of
intravenous contrast.
CONTRAST:  80mL OMNIPAQUE IOHEXOL 300 MG/ML  SOLN

[Series 2: abd/ pelvis 5.0 i30f 1 · axial · 0.65mm/px · z∈[-434,-34]mm · 13 of 90 slices shown, 15 images]
[im 5/90  soft-tissue]
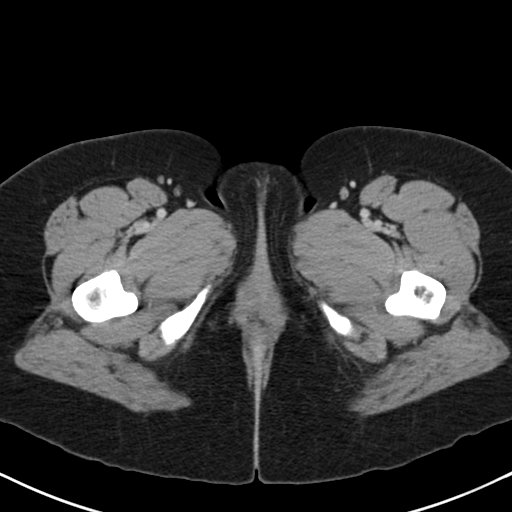
[im 5/90  bone]
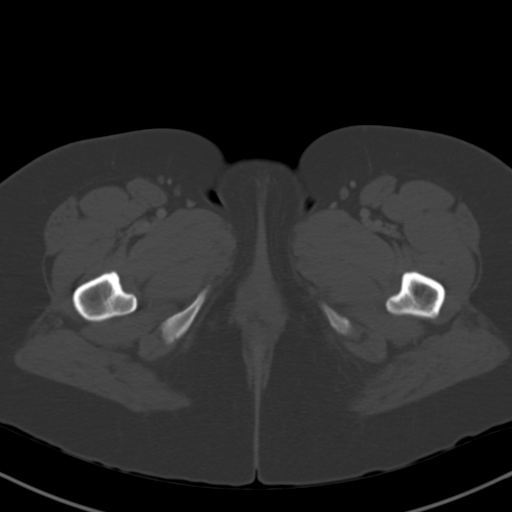
[im 15/90  soft-tissue]
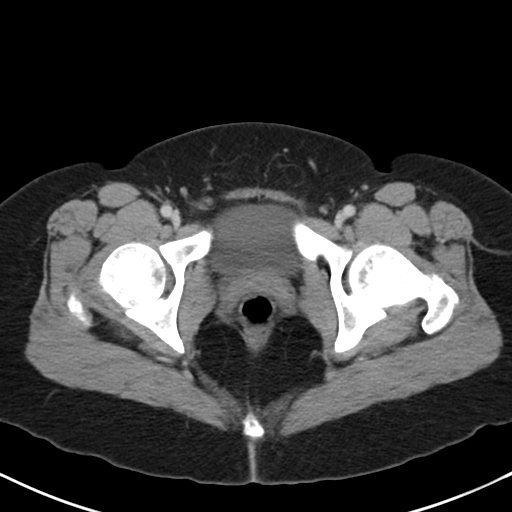
[im 19/90  soft-tissue]
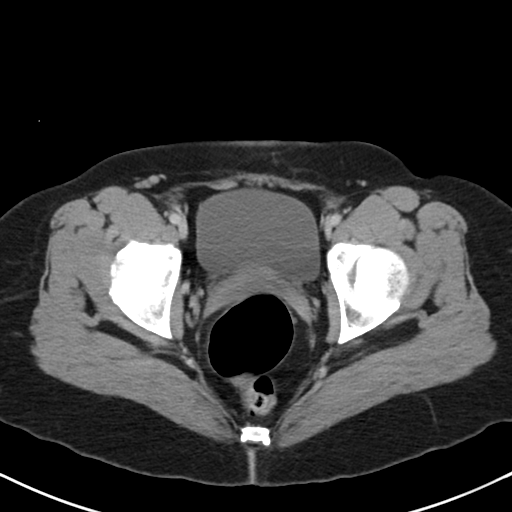
[im 24/90  soft-tissue]
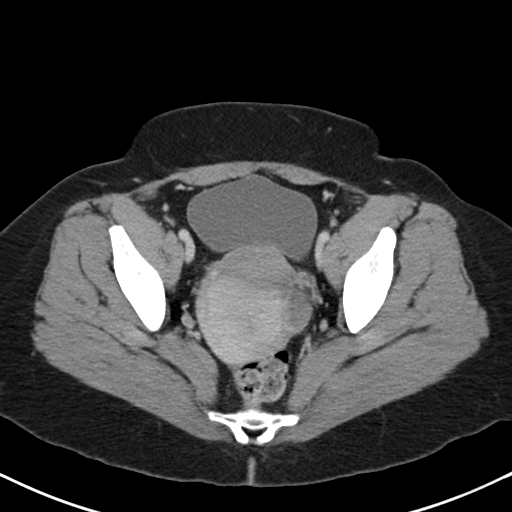
[im 33/90  soft-tissue]
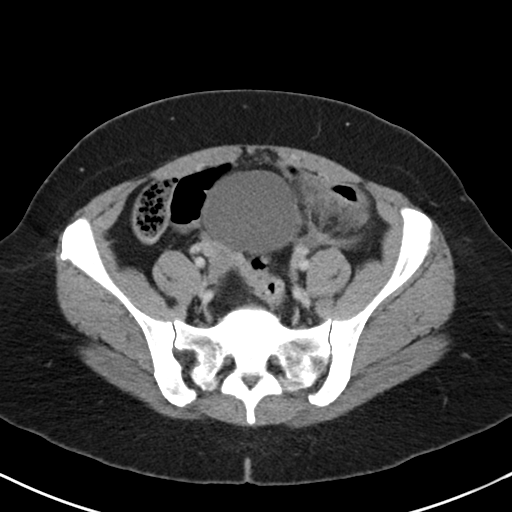
[im 38/90  soft-tissue]
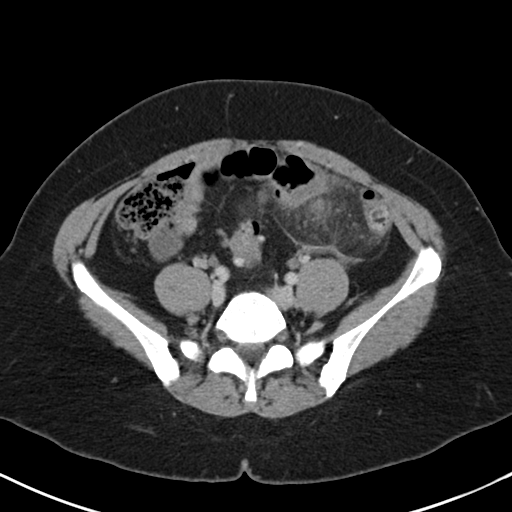
[im 47/90  soft-tissue]
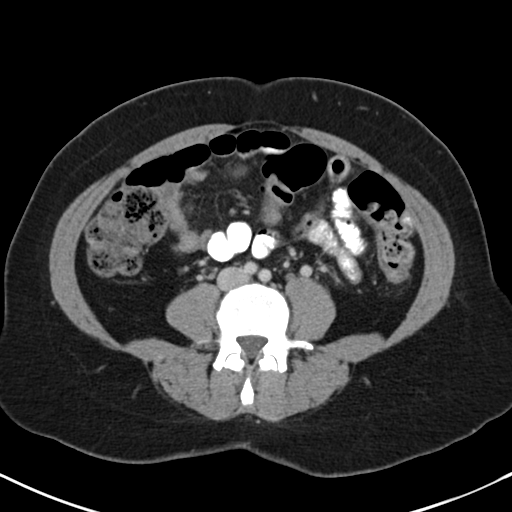
[im 52/90  soft-tissue]
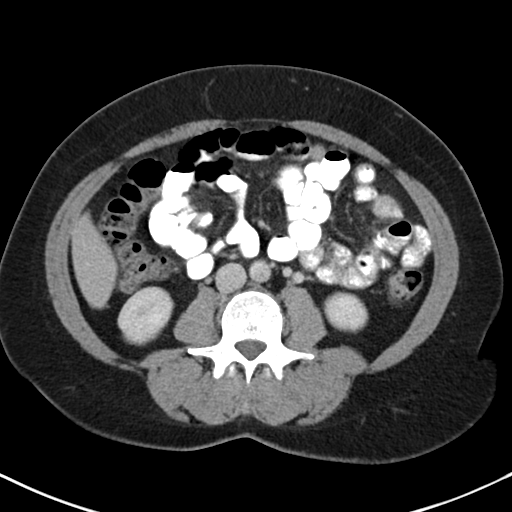
[im 57/90  soft-tissue]
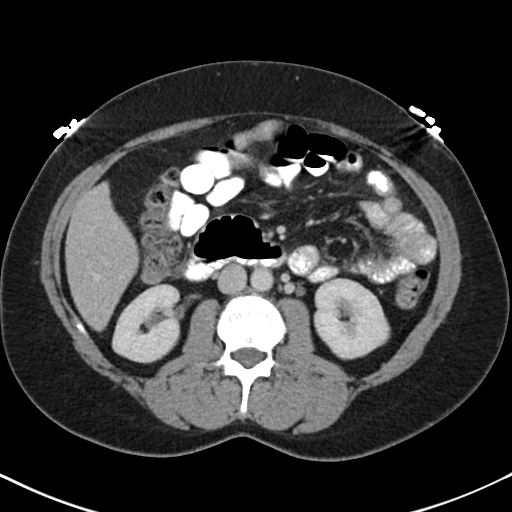
[im 57/90  bone]
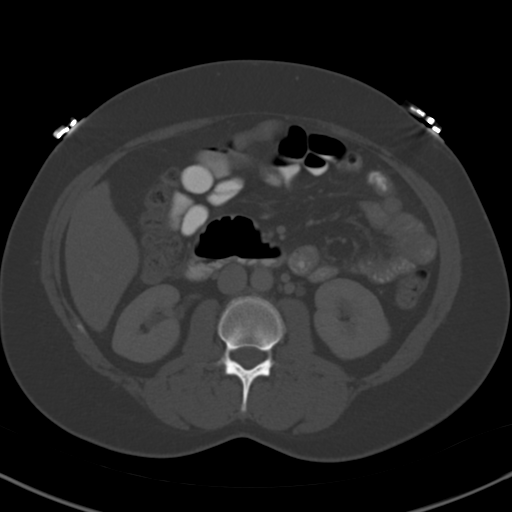
[im 66/90  soft-tissue]
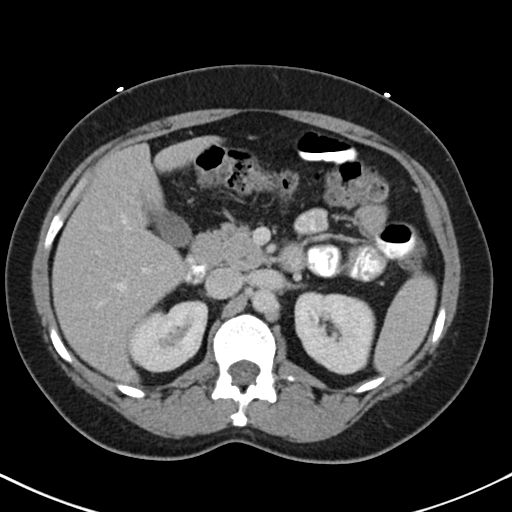
[im 71/90  soft-tissue]
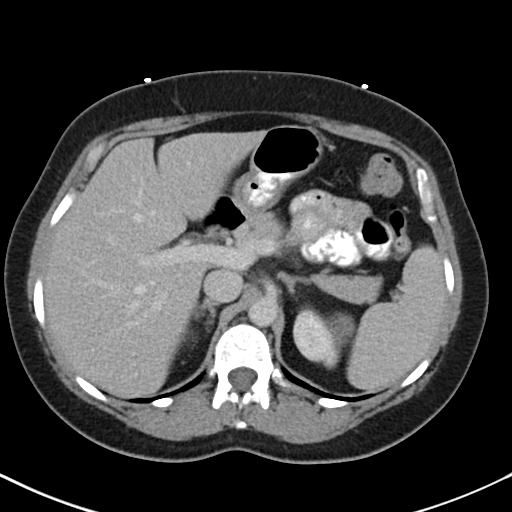
[im 75/90  soft-tissue]
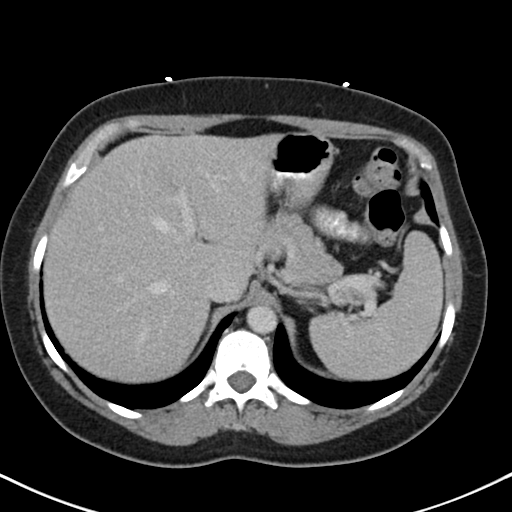
[im 85/90  soft-tissue]
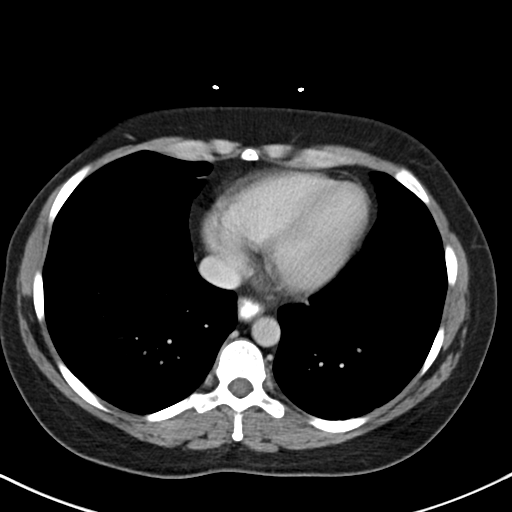

[Series 5: coronal soft tissue · coronal · 0.79mm/px · 3 of 70 slices shown]
[im 24/70  soft-tissue]
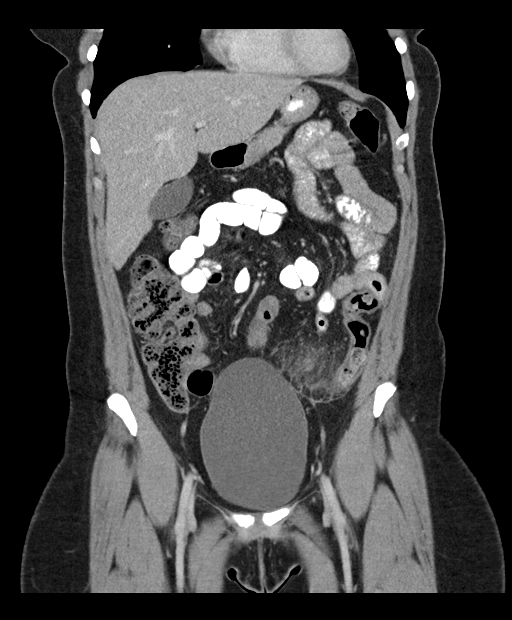
[im 31/70  soft-tissue]
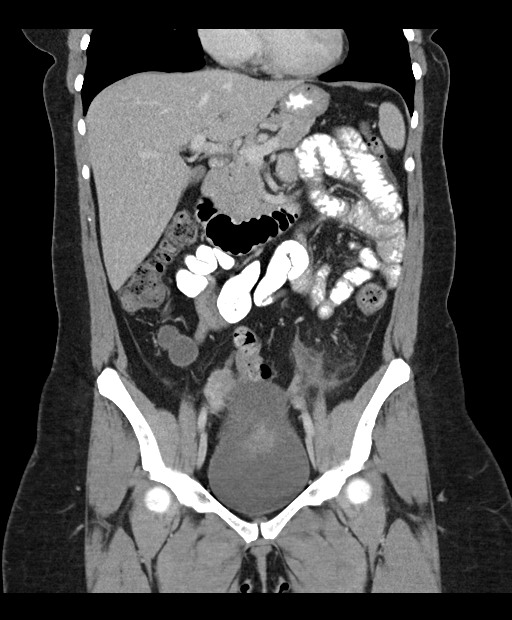
[im 39/70  soft-tissue]
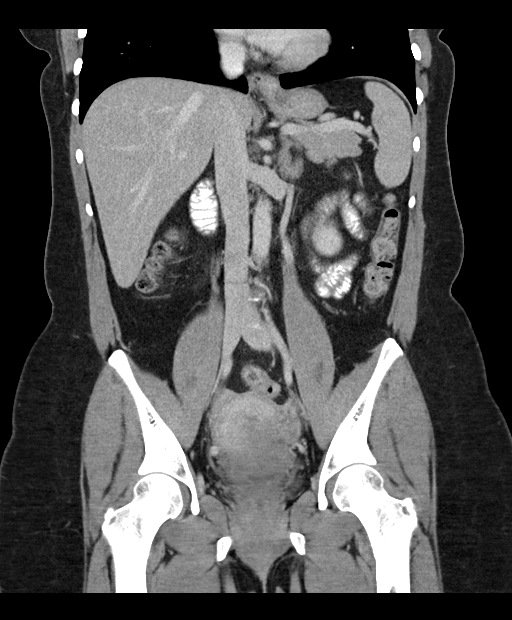

[16 of 46 positions shown; findings below may reference images not displayed]

FINDINGS: BODY WALL: Unremarkable.

LOWER CHEST: Unremarkable.

ABDOMEN/PELVIS:

Liver: No focal abnormality.

Biliary: No evidence of biliary obstruction or stone.

Pancreas: Unremarkable.

Spleen: Unremarkable.

Adrenals: Unremarkable.

Kidneys and ureters: No hydronephrosis or stone.

Bladder: Unremarkable.

Reproductive: Hypoenhancing intramural and sub serosal mass from the
ventral uterine body which measures up to 5 cm. This appears
discrete from the left ovary.

Bowel: Sigmoid diverticulosis with focally inflamed diverticulum.
Mesosigmoid infiltration and anti mesenteric sigmoid colonic wall
thickening from submucosal edema. No free perforation or abscess. No
bowel obstruction. No appendiceal inflammation. Small sliding-type
hiatal hernia.

Retroperitoneum: No mass or adenopathy.

Peritoneum: No ascites or pneumoperitoneum.

Vascular: No acute abnormality.

OSSEOUS: No acute abnormalities.
IMPRESSION: 1. Sigmoid diverticulitis without abscess or perforation.
2. 5 cm uterine fibroid.

## 2016-04-09 IMAGING — CT CT ABD-PELV W/ CM
2 of 5 series · 17 of 46 positions shown, 19 images · IV contrast (OMNIPAQUE)
Comparison: 08/26/2014.

CLINICAL DATA: Diverticulitis of intestine without perforation or
abscess without bleeding. Progressive left lower quadrant pain after
antibiotic therapy. Subsequent encounter.

EXAM:
CT ABDOMEN AND PELVIS WITH CONTRAST
TECHNIQUE: Multidetector CT imaging of the abdomen and pelvis was performed
using the standard protocol following bolus administration of
intravenous contrast.
CONTRAST:  100mL OMNIPAQUE IOHEXOL 300 MG/ML  SOLN

[Series 2: rtn a/p with · axial · 0.74mm/px · z∈[+855,+1245]mm · 14 of 88 slices shown, 16 images]
[im 5/88  soft-tissue]
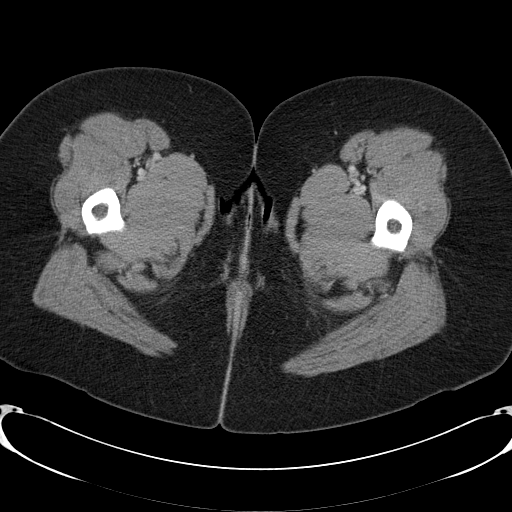
[im 5/88  bone]
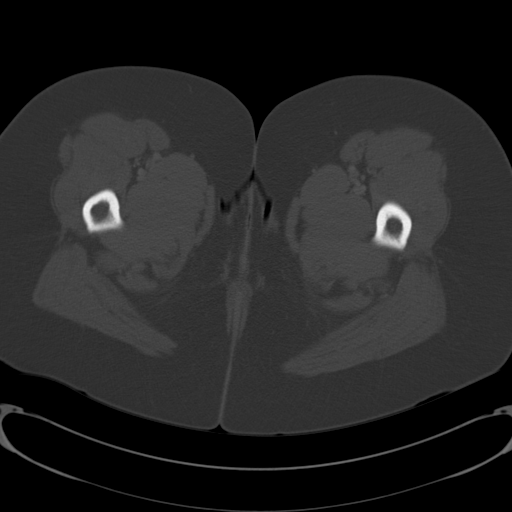
[im 10/88  soft-tissue]
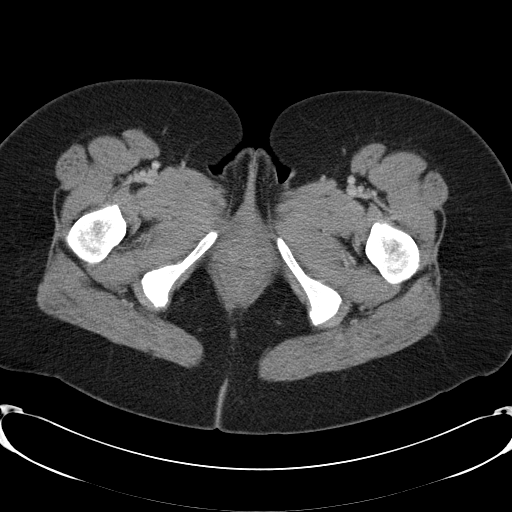
[im 19/88  soft-tissue]
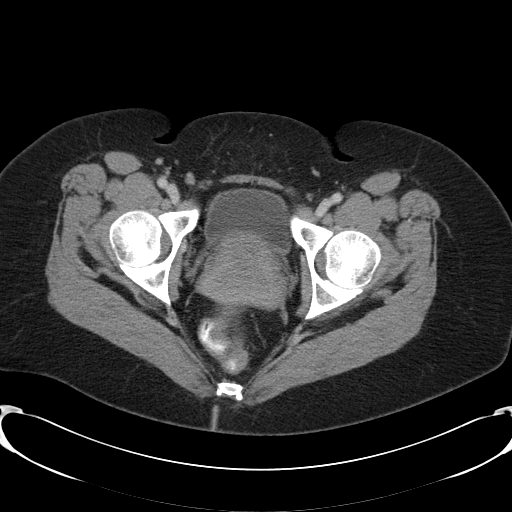
[im 23/88  soft-tissue]
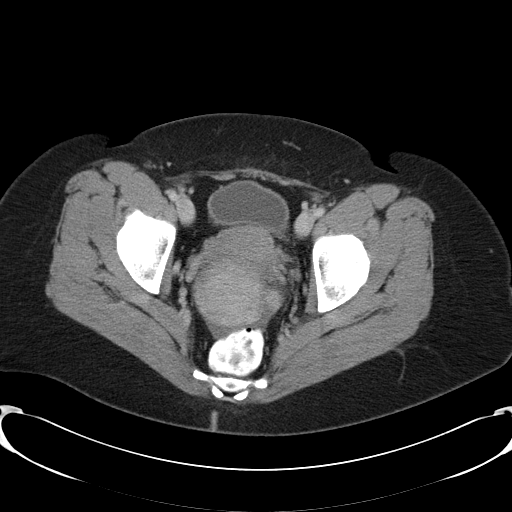
[im 28/88  soft-tissue]
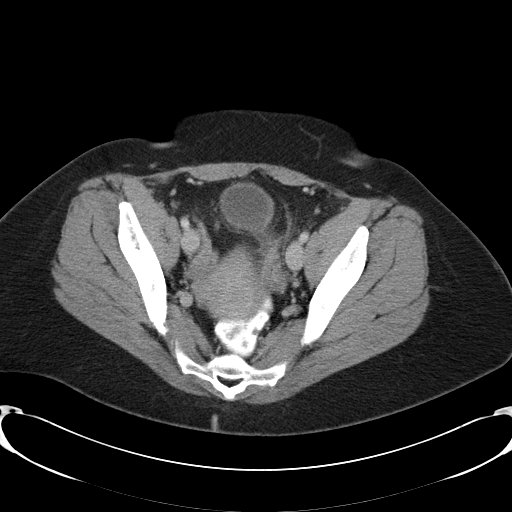
[im 37/88  soft-tissue]
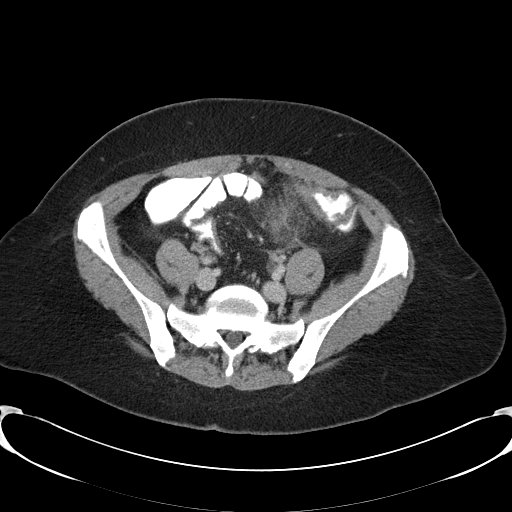
[im 42/88  soft-tissue]
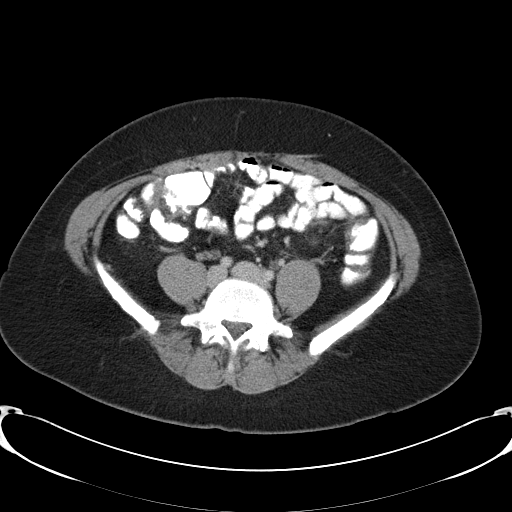
[im 46/88  soft-tissue]
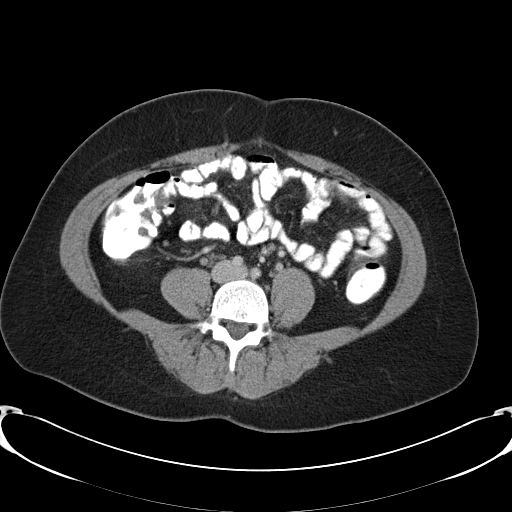
[im 51/88  soft-tissue]
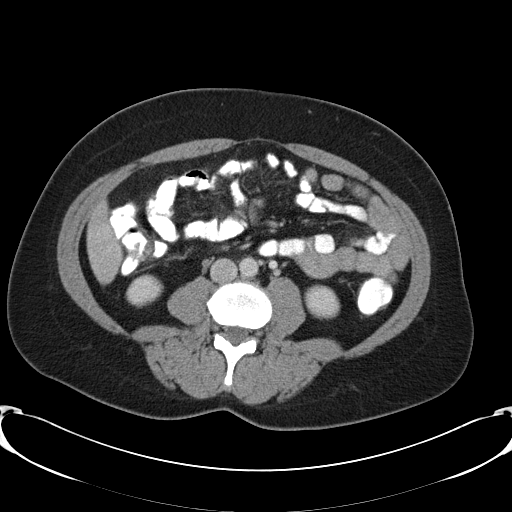
[im 51/88  bone]
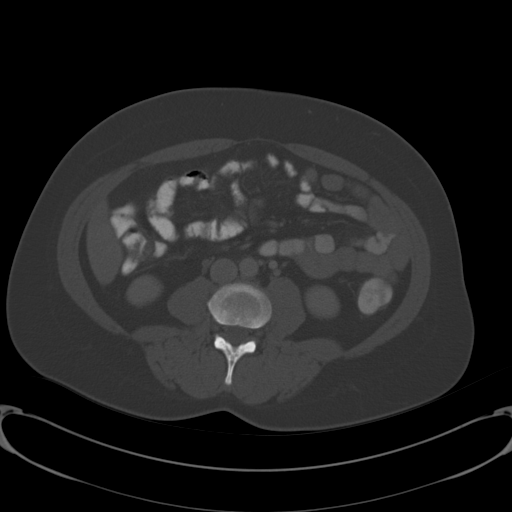
[im 60/88  soft-tissue]
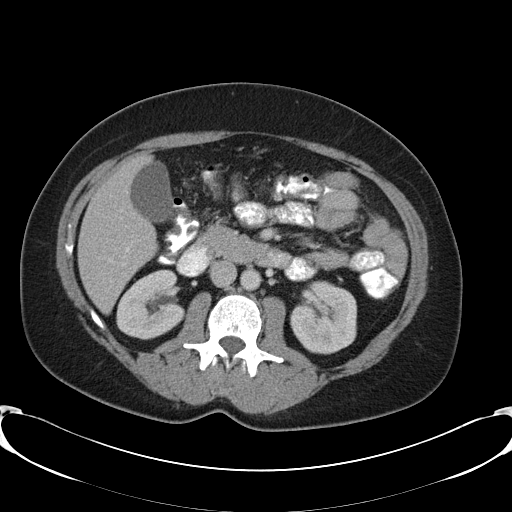
[im 65/88  soft-tissue]
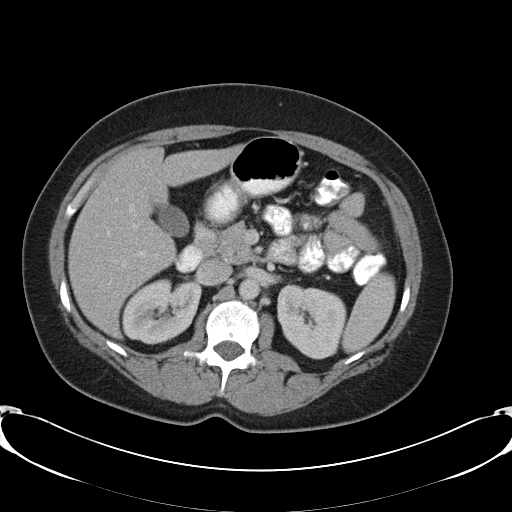
[im 69/88  soft-tissue]
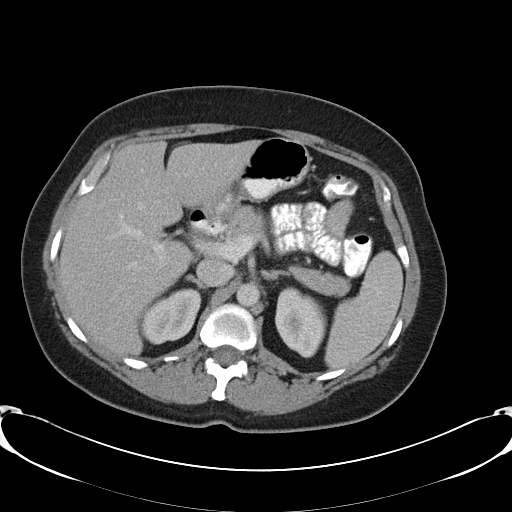
[im 78/88  soft-tissue]
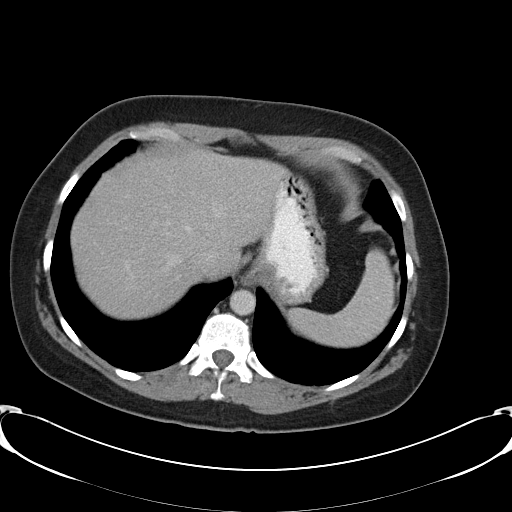
[im 83/88  soft-tissue]
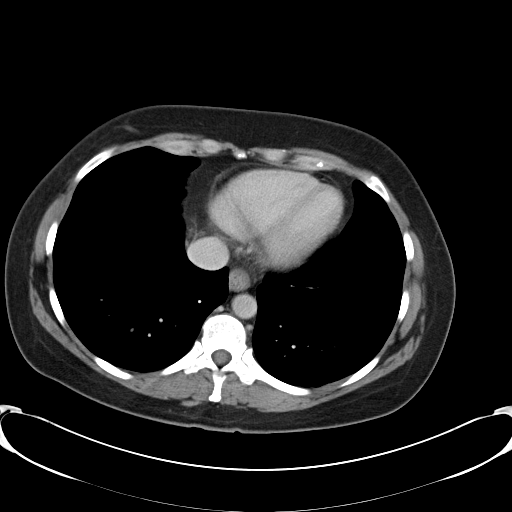

[Series 602: <mpr thick range> · coronal · 0.86mm/px · 3 of 121 slices shown]
[im 41/121  soft-tissue]
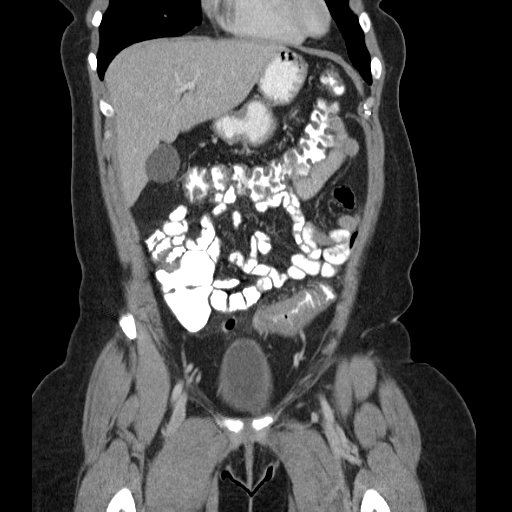
[im 54/121  soft-tissue]
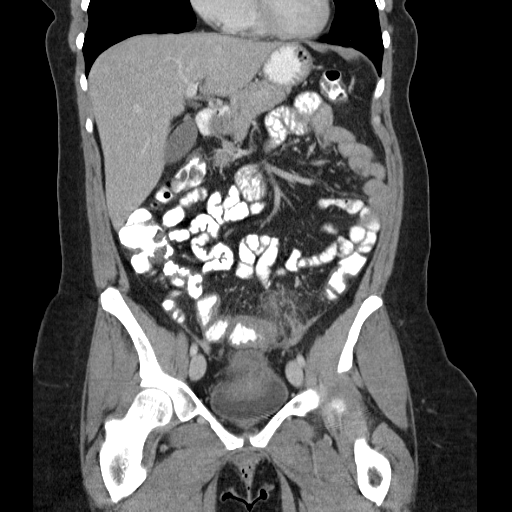
[im 67/121  soft-tissue]
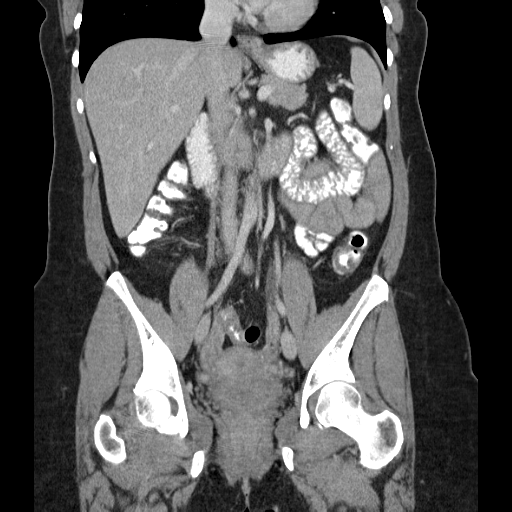

[17 of 46 positions shown; findings below may reference images not displayed]

FINDINGS: Lower chest: Lung bases show no acute findings. Heart size normal.
No pericardial or pleural effusion.

Hepatobiliary: Liver and gallbladder are unremarkable. No biliary
ductal dilatation.

Pancreas: Negative.

Spleen: Negative.

Adrenals/Urinary Tract: Adrenal glands and kidneys are unremarkable.
There is minimal dilatation of the mid left ureter, similar to the
prior exam. Ureters are otherwise decompressed. Bladder is low in
volume.

Stomach/Bowel: Tiny hiatal hernia. Stomach, small bowel, appendix
and majority of the colon are otherwise unremarkable. Thickening of
the wall of the sigmoid colon may have progressed in the interval,
with surrounding haziness and stranding in the adjacent fat. No
organized fluid collection or extraluminal air.

Vascular/Lymphatic: Vascular structures are grossly unremarkable. No
pathologically enlarged lymph nodes.

Reproductive: There may be a 1.8 cm low-attenuation uterine fibroid.
Ovaries are visualized.

Other: Tiny pelvic free fluid. Mesenteries and peritoneum are
otherwise unremarkable.

Musculoskeletal: No worrisome lytic or sclerotic lesions.
IMPRESSION: Persistent sigmoid diverticulitis without perforation or abscess.

## 2016-06-08 ENCOUNTER — Ambulatory Visit (HOSPITAL_COMMUNITY)
Admission: EM | Admit: 2016-06-08 | Discharge: 2016-06-08 | Disposition: A | Discharge status: Home / Self Care | Payer: BLUE CROSS/BLUE SHIELD

## 2016-06-08 ENCOUNTER — Encounter (HOSPITAL_COMMUNITY): Payer: Self-pay | Admitting: *Deleted

## 2016-06-08 DIAGNOSIS — K5732 Diverticulitis of large intestine without perforation or abscess without bleeding: Secondary | ICD-10-CM

## 2016-06-08 HISTORY — DX: Diverticulitis of intestine, part unspecified, without perforation or abscess without bleeding: K57.92

## 2016-06-08 MED ORDER — METRONIDAZOLE 500 MG PO TABS: 500.0000 mg | ORAL_TABLET | Freq: Three times a day (TID) | ORAL | 0 refills | Status: AC

## 2016-06-08 MED ORDER — CIPROFLOXACIN HCL 500 MG PO TABS: 500.0000 mg | ORAL_TABLET | Freq: Two times a day (BID) | ORAL | 0 refills | Status: AC

## 2016-06-08 NOTE — ED Triage Notes (Signed)
Patient reports LLQ pain since yesterday, waxing and yawning in intensity, reports mild fever last night. Reports hx of diverticulitis approx 2 years ago. No associated symptoms other than pain and fever.

## 2016-06-08 NOTE — ED Provider Notes (Signed)
CSN: [id number]     Arrival date & time 06/08/16  1013 History   None    Chief Complaint  Patient presents with  . Abdominal Pain   (Consider location/radiation/quality/duration/timing/severity/associated sxs/prior Treatment) Patient has LLQ abdominal discomfort and is feeling sick.  She has hx of diverticulitis and she states she feels like she is getting another case of diverticulitis.   The history is provided by the patient.  Abdominal Pain  Pain location:  LLQ Pain quality: aching   Pain radiates to:  Does not radiate Pain severity:  Mild Onset quality:  Sudden Duration:  2 days Timing:  Constant Chronicity:  New Relieved by:  Nothing Worsened by:  Nothing Ineffective treatments:  Acetaminophen Associated symptoms: fatigue     Past Medical History:  Diagnosis Date  . Bacterial infection    h/o  . Chlamydia    h/o  . Diverticulitis   . H/O bladder infections   . Rheumatoid arthritis(714.0)    Past Surgical History:  Procedure Laterality Date  . CESAREAN SECTION    . SHOULDER SURGERY    . TUBAL LIGATION     Family History  Problem Relation Age of Onset  . Cancer Maternal Grandmother     skin  . Prostate cancer Father   . Thyroid disease Mother     hypo   Social History  Substance Use Topics  . Smoking status: Never Smoker  . Smokeless tobacco: Never Used  . Alcohol use Yes     Comment: 2-3 times weekly   OB History    Gravida Para Term Preterm AB Living   3         3   SAB TAB Ectopic Multiple Live Births                 Review of Systems  Constitutional: Positive for fatigue.  HENT: Negative.   Eyes: Negative.   Respiratory: Negative.   Gastrointestinal: Positive for abdominal pain.  Endocrine: Negative.   Genitourinary: Negative.   Musculoskeletal: Negative.   Skin: Negative.   Allergic/Immunologic: Negative.   Neurological: Negative.   Hematological: Negative.   Psychiatric/Behavioral: Negative.     Allergies  Codeine  Home  Medications   Prior to Admission medications   Medication Sig Start Date End Date Taking? Authorizing Provider  methotrexate (RHEUMATREX) 7.5 MG tablet Take 7.5 mg by mouth once a week. Caution" Chemotherapy. Protect from light.   Yes Historical Provider, MD  Adalimumab (HUMIRA [place]) Inject 1 each into the skin every 14 (fourteen) days.     Historical Provider, MD  Ca Carbonate-Mag Hydroxide (ROLAIDS PO) Take 1-4 each by mouth at bedtime.    Historical Provider, MD  ciprofloxacin (CIPRO) 500 MG tablet Take 1 tablet (500 mg total) by mouth every 12 (twelve) hours. 08/26/14   Rolland Porter, MD  ciprofloxacin (CIPRO) 500 MG tablet Take 1 tablet (500 mg total) by mouth 2 (two) times daily. 06/08/16   Deatra Canter, FNP  docusate sodium (COLACE) 100 MG capsule Take 1 capsule (100 mg total) by mouth every 12 (twelve) hours. 08/26/14   Rolland Porter, MD  HYDROcodone-acetaminophen (NORCO/VICODIN) 5-325 MG per tablet Take 2 tablets by mouth every 4 (four) hours as needed. 08/26/14   Rolland Porter, MD  HYDROcodone-acetaminophen (NORCO/VICODIN) 5-325 MG per tablet Take 2 tablets by mouth every 4 (four) hours as needed for severe pain. 08/27/14   Tomasita Crumble, MD  metroNIDAZOLE (FLAGYL) 500 MG tablet Take 1 tablet (500 mg total) by mouth  2 (two) times daily. 08/26/14   Rolland Porter, MD  metroNIDAZOLE (FLAGYL) 500 MG tablet Take 1 tablet (500 mg total) by mouth 3 (three) times daily. 06/08/16   Deatra Canter, FNP  ondansetron (ZOFRAN ODT) 4 MG disintegrating tablet Take 1 tablet (4 mg total) by mouth every 8 (eight) hours as needed for nausea. 08/26/14   Rolland Porter, MD   Meds Ordered and Administered this Visit  Medications - No data to display  BP 129/87 (BP Location: Left Arm)   Pulse 105   Temp 98.7 F (37.1 C) (Oral)   Resp 17   LMP 05/29/2016   SpO2 100%  No data found.   Physical Exam  Constitutional: She is oriented to person, place, and time. She appears well-developed and well-nourished.  HENT:   Head: Normocephalic and atraumatic.  Eyes: Conjunctivae and EOM are normal. Pupils are equal, round, and reactive to light.  Neck: Normal range of motion. Neck supple.  Cardiovascular: Normal rate, regular rhythm and normal heart sounds.   Pulmonary/Chest: Effort normal and breath sounds normal.  Abdominal: Soft. Bowel sounds are normal. There is tenderness.  Tender LLQ w/o peritoneal signs.  Musculoskeletal: Normal range of motion.  Neurological: She is alert and oriented to person, place, and time.    Urgent Care Course   Clinical Course    Procedures (including critical care time)  Labs Review Labs Reviewed - No data to display  Imaging Review No results found.   Visual Acuity Review  Right Eye Distance:   Left Eye Distance:   Bilateral Distance:    Right Eye Near:   Left Eye Near:    Bilateral Near:         MDM   1. Diverticulitis of large intestine without perforation or abscess without bleeding    Cipro  one po bid x 10 days #20 Flagyl  one po tid x 10 days #30 Patient is allergic to codeine and does not tolerate Hydrocodone.  She declines hydrocodone and will take tylenol and motrin otc as directed.  Discussed if sx's worsen then go to ED.  Follow up with PCP.     Deatra Canter, FNP 06/08/16 [phone]

## 2017-06-20 ENCOUNTER — Other Ambulatory Visit: Payer: Self-pay | Admitting: Gastroenterology

## 2017-06-20 DIAGNOSIS — R1030 Lower abdominal pain, unspecified: Secondary | ICD-10-CM

## 2017-06-20 NOTE — Progress Notes (Signed)
Mikayla Cullinane MD 

## 2017-06-26 ENCOUNTER — Ambulatory Visit
Admission: RE | Admit: 2017-06-26 | Discharge: 2017-06-26 | Disposition: A | Discharge status: Home / Self Care | Payer: BLUE CROSS/BLUE SHIELD | Source: Ambulatory Visit | Attending: Gastroenterology | Admitting: Gastroenterology

## 2017-06-26 DIAGNOSIS — R1030 Lower abdominal pain, unspecified: Secondary | ICD-10-CM

## 2017-06-26 MED ORDER — IOPAMIDOL (ISOVUE-300) INJECTION 61%
100.0000 mL | Freq: Once | INTRAVENOUS | Status: AC | PRN
  Administered 2017-06-26: 100 mL via INTRAVENOUS
